# Patient Record
Sex: Male | Born: 1995 | Race: White | Hispanic: No | Smoking: Former smoker
Health system: Southern US, Community
[De-identification: ages and names within clinical notes are randomized; demographics above are authoritative.]

## PROBLEM LIST (undated history)

## (undated) DIAGNOSIS — Z889 Allergy status to unspecified drugs, medicaments and biological substances status: Secondary | ICD-10-CM

## (undated) HISTORY — PX: HX OTHER: 2100001105

## (undated) HISTORY — DX: Allergy status to unspecified drugs, medicaments and biological substances: Z88.9

## (undated) HISTORY — PX: HX SHOULDER SURGERY: 2100001311

## (undated) HISTORY — PX: SHOULDER ARTHROSCOPY W/ LABRAL REPAIR: SHX2399

## (undated) HISTORY — PX: MOUTH SURGERY: SHX715

## (undated) HISTORY — PX: KNEE ARTHROSCOPY: SHX127

---
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
## 2014-06-13 ENCOUNTER — Ambulatory Visit (HOSPITAL_COMMUNITY): Payer: Self-pay

## 2014-06-20 ENCOUNTER — Ambulatory Visit (INDEPENDENT_AMBULATORY_CARE_PROVIDER_SITE_OTHER): Payer: BC Managed Care – PPO | Admitting: Sports Medicine

## 2014-06-20 ENCOUNTER — Encounter (INDEPENDENT_AMBULATORY_CARE_PROVIDER_SITE_OTHER): Payer: Self-pay | Admitting: Sports Medicine

## 2014-06-20 VITALS — Ht 70.5 in | Wt 183.0 lb

## 2014-06-20 DIAGNOSIS — S83289A Other tear of lateral meniscus, current injury, unspecified knee, initial encounter: Secondary | ICD-10-CM

## 2014-06-20 DIAGNOSIS — S83282A Other tear of lateral meniscus, current injury, left knee, initial encounter: Secondary | ICD-10-CM

## 2014-06-24 NOTE — H&P (Signed)
[facility]  [address]  [address]  [place], [place] [address]  [phone]      OFFICE VISIT    PATIENT NAME:      Dustin Williams, Dustin Williams  VISIT IDENTIFICATION   [id number]  MEDICAL RECORD NUMBER [id number]    DICTATING PHYSICIAN: Duard Larsen, MD   REFERRING PHYSICIAN:         DOB: [birth year]  DOS: 06/20/2014    cc:      CHIEF COMPLAINT:  Left knee pain.     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 He had a twisting injury to the left knee. Since that time, he has been  experiencing left lateral joint line pain. He has never had issues with this  knee prior. The pain is sharp. It is lateral along the joint line, worse with  twisting, bending, squatting, and football activities. It is moderate in  severity.     PAST MEDICAL HISTORY:  None.     PAST SURGICAL HISTORY:  Labrum repair in the past.     FAMILY HISTORY:  None.     SOCIAL HISTORY:  Does not smoke, use alcohol or drugs.       MEDICATIONS:  Motrin.     ALLERGIES:  None.     REVIEW SYSTEMS:  Review of systems includes left knee pain. No chest pain or shortness of  breath. No ear, nose or throat issues. No endocrinopathies. No visual changes.  No nausea, vomiting, or diarrhea. No urinary symptoms. No skin, neuro, psych,  respiratory, or lymphatic issues     PHYSICAL EXAMINATION:  VITAL SIGNS:  Height: 70.5 inches. Weight: 183 pounds. BMI: 25.8.   GENERAL:  He is alert, oriented and answers questions appropriately. He is  pleasant. Walks with a normal gait and has normal affect.   HEENT: Normal.   LUNGS:  Breathing is clear and unlabored.   ABDOMEN:  Soft.   EXTREMITIES:  Physical examination of the left knee: Mild to moderate  effusion. Range of motion is just shy of full extension. Comfortably can flex  to 140 degrees. Pain with hyperflexion. Lateral joint line tenderness to  palpation and lateral pain with McMurray's. No medial pain. ACL, PCL, MCL, and  LCL are intact. No patellofemoral  signs. Quad has good tone. Extensor  mechanism is strong and intact. Calf is soft and supple. Skin is normal.  Normal motor, sensory and vascular exam distally. Passive range of motion of  the hip does not reproduce any pain.     IMAGING:   MRI was reviewed and shows complex lateral meniscus tear of the left knee.     IMPRESSION:  Left knee lateral meniscus tear.     PLAN:  Left knee arthroscopy and partial lateral meniscectomy. Risk, benefits and  details of that were explained to the patient. He agreed to proceed and gave  informed written consent.                                 Duard Larsen, MD    d:  06/21/2014 13:54:38  t:  06/24/2014 13:55:27  cw  doc#:   [id number]/voice#:  [id number]  <START FOOTER> Page 2 of 2  <end footer>

## 2014-07-03 DIAGNOSIS — S83289A Other tear of lateral meniscus, current injury, unspecified knee, initial encounter: Secondary | ICD-10-CM

## 2014-07-18 ENCOUNTER — Encounter (INDEPENDENT_AMBULATORY_CARE_PROVIDER_SITE_OTHER): Payer: Self-pay | Admitting: Sports Medicine

## 2016-10-16 ENCOUNTER — Encounter (HOSPITAL_COMMUNITY): Payer: Self-pay | Admitting: Emergency Medicine

## 2016-10-16 ENCOUNTER — Emergency Department (HOSPITAL_COMMUNITY)
Admission: EM | Admit: 2016-10-16 | Discharge: 2016-10-16 | Disposition: A | Discharge status: Home / Self Care | Payer: BLUE CROSS/BLUE SHIELD | Attending: Emergency Medicine | Admitting: Emergency Medicine

## 2016-10-16 DIAGNOSIS — Y929 Unspecified place or not applicable: Secondary | ICD-10-CM | POA: Insufficient documentation

## 2016-10-16 DIAGNOSIS — Y9389 Activity, other specified: Secondary | ICD-10-CM | POA: Diagnosis not present

## 2016-10-16 DIAGNOSIS — Y999 Unspecified external cause status: Secondary | ICD-10-CM | POA: Insufficient documentation

## 2016-10-16 DIAGNOSIS — S3994XA Unspecified injury of external genitals, initial encounter: Secondary | ICD-10-CM

## 2016-10-16 DIAGNOSIS — X58XXXA Exposure to other specified factors, initial encounter: Secondary | ICD-10-CM | POA: Insufficient documentation

## 2016-10-16 DIAGNOSIS — S3021XA Contusion of penis, initial encounter: Secondary | ICD-10-CM | POA: Insufficient documentation

## 2016-10-16 DIAGNOSIS — S3093XA Unspecified superficial injury of penis, initial encounter: Secondary | ICD-10-CM | POA: Diagnosis present

## 2016-10-16 DIAGNOSIS — F17290 Nicotine dependence, other tobacco product, uncomplicated: Secondary | ICD-10-CM | POA: Insufficient documentation

## 2016-10-16 NOTE — ED Triage Notes (Signed)
Pt reports he has an area on his penis that started swelling last night and has turned black today.

## 2016-10-16 NOTE — Discharge Instructions (Signed)
No ibuprofen, tylenol 3 times a day for pain / ice and rest.

## 2016-10-16 NOTE — ED Provider Notes (Signed)
[facility]-EMERGENCY DEPT Provider Note   CSN: [id number] Arrival date & time: 10/16/16  1155  By signing my name below, I, Sonum Patel, attest that this documentation has been prepared under the direction and in the presence of Eber HongBrian Lynn Sissel, MD. Electronically Signed: Sonum Patel, Neurosurgeoncribe. 10/16/16. 12:24 PM.  History   Chief Complaint Chief Complaint  Patient presents with  . Groin Swelling    The history is provided by the patient. No language interpreter was used.     HPI Comments: Anthony Archer is a 20 y.o. male who presents to the Emergency Department complaining of constant, unchanged penile pain with associated swelling and bruising to the right side of the penis that began last night. Patient states he was having sexual intercourse last night and noticed sharp pain at the end. He states at first he noticed the pain and then he saw bruising and swelling. He denies any rough or aggressive sex. He states the pain is not associated with ejaculation, urination, and was not present during sex. He states he is able to urinate normally since the onset of pain. He denies penile issues or surgeries in the past. He denies dysuria, hematuria, blood in ejaculate, testicular pain.   History reviewed. No pertinent past medical history.  There are no active problems to display for this patient.   Past Surgical History:  Procedure Laterality Date  . KNEE ARTHROSCOPY    . MOUTH SURGERY    . SHOULDER ARTHROSCOPY W/ LABRAL REPAIR         Home Medications    Prior to Admission medications   Not on File    Family History Family History  Problem Relation Age of Onset  . Heart disease Other     Social History Social History  Substance Use Topics  . Smoking status: Former Games developermoker  . Smokeless tobacco: Current User    Types: Snuff  . Alcohol use Yes     Comment: once a week     Allergies   Patient has no known allergies.   Review of Systems Review of Systems  Constitutional:  Negative for fever.  Genitourinary: Positive for penile pain. Negative for dysuria, hematuria, scrotal swelling and testicular pain.     Physical Exam Updated Vital Signs BP 131/86 (BP Location: Left Arm)   Pulse 78   Temp 98 F (36.7 C) (Oral)   Resp 16   Ht 5\' 10"  (1.778 m)   Wt 193 lb (87.5 kg)   SpO2 100%   BMI 27.69 kg/m   Physical Exam  Constitutional: He appears well-developed and well-nourished.  HENT:  Head: Normocephalic and atraumatic.  Eyes: Conjunctivae are normal. Right eye exhibits no discharge. Left eye exhibits no discharge.  Pulmonary/Chest: Effort normal. No respiratory distress.  Abdominal:  Non tender abd  Genitourinary:  Genitourinary Comments: Ecchymosis to right shaft of penis that spreads laterally to the right and superiorly to dorsal surface. No corona involvement. No blood at meatus. Normal scrotum or testicles    Neurological: He is alert. Coordination normal.  Skin: Skin is warm and dry. No rash noted. He is not diaphoretic. No erythema.  Psychiatric: He has a normal mood and affect.  Nursing note and vitals reviewed.    ED Treatments / Results  DIAGNOSTIC STUDIES: Oxygen Saturation is 100% on RA, normal by my interpretation.    COORDINATION OF CARE: 12:24 PM Will consult with urology. Discussed treatment plan with pt at bedside and pt agreed to plan.    Labs (all  labs ordered are listed, but only abnormal results are displayed) Labs Reviewed - No data to display   Radiology No results found.  Procedures Procedures (including critical care time)  Medications Ordered in ED Medications - No data to display   Initial Impression / Assessment and Plan / ED Course  I have reviewed the triage vital signs and the nursing notes.  Pertinent labs & imaging results that were available during my care of the patient were reviewed by me and considered in my medical decision making (see chart for details).  Clinical Course      No deep  deformity along shaft, normal alignement, swelling is down, bruising is present D/w Dr. Retta Dionesahlstedt at 12:30 - will f/u in 10 - 14 daysa Pt informed, agreeable stable for d/c.  Final Clinical Impressions(s) / ED Diagnoses   Final diagnoses:  Penis injury, initial encounter    New Prescriptions New Prescriptions   No medications on file   I personally performed the services described in this documentation, which was scribed in my presence. The recorded information has been reviewed and is accurate.      Eber HongBrian Floy Riegler, MD 10/16/16 [phone]

## 2019-12-25 ENCOUNTER — Encounter (INDEPENDENT_AMBULATORY_CARE_PROVIDER_SITE_OTHER): Payer: Self-pay

## 2021-01-22 ENCOUNTER — Emergency Department (HOSPITAL_COMMUNITY): Payer: BC Managed Care – PPO

## 2021-01-22 ENCOUNTER — Emergency Department (HOSPITAL_COMMUNITY)
Admission: EM | Admit: 2021-01-22 | Discharge: 2021-01-22 | Disposition: A | Discharge status: Home / Self Care | Payer: BC Managed Care – PPO | Attending: Emergency Medicine | Admitting: Emergency Medicine

## 2021-01-22 ENCOUNTER — Encounter (HOSPITAL_COMMUNITY): Payer: Self-pay | Admitting: *Deleted

## 2021-01-22 ENCOUNTER — Other Ambulatory Visit: Payer: Self-pay

## 2021-01-22 DIAGNOSIS — Y92410 Unspecified street and highway as the place of occurrence of the external cause: Secondary | ICD-10-CM | POA: Insufficient documentation

## 2021-01-22 DIAGNOSIS — Z23 Encounter for immunization: Secondary | ICD-10-CM | POA: Diagnosis not present

## 2021-01-22 DIAGNOSIS — Z87891 Personal history of nicotine dependence: Secondary | ICD-10-CM | POA: Diagnosis not present

## 2021-01-22 DIAGNOSIS — S0502XA Injury of conjunctiva and corneal abrasion without foreign body, left eye, initial encounter: Secondary | ICD-10-CM | POA: Diagnosis not present

## 2021-01-22 DIAGNOSIS — S0101XA Laceration without foreign body of scalp, initial encounter: Secondary | ICD-10-CM | POA: Diagnosis not present

## 2021-01-22 DIAGNOSIS — S0003XA Contusion of scalp, initial encounter: Secondary | ICD-10-CM

## 2021-01-22 DIAGNOSIS — S0990XA Unspecified injury of head, initial encounter: Secondary | ICD-10-CM | POA: Diagnosis present

## 2021-01-22 MED ORDER — LIDOCAINE-EPINEPHRINE (PF) 2 %-1:200000 IJ SOLN
5.0000 mL | Freq: Once | INTRAMUSCULAR | Status: DC
  Filled 2021-01-22: qty 10

## 2021-01-22 MED ORDER — TETRACAINE HCL 0.5 % OP SOLN
2.0000 [drp] | Freq: Once | OPHTHALMIC | Status: AC
  Administered 2021-01-22: 2 [drp] via OPHTHALMIC
  Filled 2021-01-22: qty 4

## 2021-01-22 MED ORDER — ERYTHROMYCIN 5 MG/GM OP OINT
TOPICAL_OINTMENT | Freq: Once | OPHTHALMIC | Status: AC
  Filled 2021-01-22: qty 3.5

## 2021-01-22 MED ORDER — TETANUS-DIPHTH-ACELL PERTUSSIS 5-2.5-18.5 LF-MCG/0.5 IM SUSY
0.5000 mL | PREFILLED_SYRINGE | Freq: Once | INTRAMUSCULAR | Status: AC
  Administered 2021-01-22: 0.5 mL via INTRAMUSCULAR
  Filled 2021-01-22: qty 0.5

## 2021-01-22 MED ORDER — IBUPROFEN 400 MG PO TABS
600.0000 mg | ORAL_TABLET | Freq: Once | ORAL | Status: AC
  Administered 2021-01-22: 600 mg via ORAL
  Filled 2021-01-22: qty 2

## 2021-01-22 MED ORDER — FLUORESCEIN SODIUM 1 MG OP STRP
1.0000 | ORAL_STRIP | Freq: Once | OPHTHALMIC | Status: AC
  Administered 2021-01-22: 1 via OPHTHALMIC
  Filled 2021-01-22: qty 1

## 2021-01-22 MED ORDER — METHOCARBAMOL 500 MG PO TABS: 500.0000 mg | ORAL_TABLET | Freq: Two times a day (BID) | ORAL | 0 refills | Status: AC | PRN
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
## 2021-01-22 NOTE — Discharge Instructions (Addendum)
Please read instructions below. You can take robaxin every 12 hours as needed for muscle spasm. Apply ice to areas of pain for 20 minutes at a time. Keep your wound clean. You can apply cool compresses to your left eye. Apply 1/2 inch ribbon to your left eye 4 times daily for 5 days to help prevent infection. Follow with the ophthalmologist to ensure proper healing.  Return to the ED if you have purulent drainage from your left eye or for vision loss.  You can treat your headache with over-the-counter medications such as tylenol as needed. Stay hydrated and get plenty of rest. Limit your screen time and complex thinking. Avoid any contact sports/activities to prevent re-injury to your head. Follow up with your primary care provider in 1 week for re-check and to be cleared to return to normal activity. Return to the ER if you develop severely worsening headache, changes in your vision, persistent vomiting, or new or concerning symptoms.

## 2021-01-22 NOTE — ED Notes (Signed)
Patient transported to radiology, scalp and L eye irrigated, woods lamp and supplies at bedside. Pt significant other at bedside. NAD noted.
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
